# Patient Record
Sex: Female | Born: 1970 | Race: White | Hispanic: No | Marital: Married | Smoking: Never smoker
Health system: Southern US, Community
[De-identification: ages and names within clinical notes are randomized; demographics above are authoritative.]

## PROBLEM LIST (undated history)

## (undated) DIAGNOSIS — I10 Essential (primary) hypertension: Secondary | ICD-10-CM

## (undated) DIAGNOSIS — K219 Gastro-esophageal reflux disease without esophagitis: Secondary | ICD-10-CM

## (undated) DIAGNOSIS — F419 Anxiety disorder, unspecified: Secondary | ICD-10-CM

## (undated) DIAGNOSIS — N189 Chronic kidney disease, unspecified: Secondary | ICD-10-CM

## (undated) HISTORY — DX: Chronic kidney disease, unspecified: N18.9

## (undated) HISTORY — DX: Anxiety disorder, unspecified: F41.9

## (undated) HISTORY — DX: Essential (primary) hypertension: I10

## (undated) HISTORY — PX: TUBAL LIGATION: SHX77

## (undated) HISTORY — PX: CHOLECYSTECTOMY: SHX55

## (undated) HISTORY — DX: Gastro-esophageal reflux disease without esophagitis: K21.9

## (undated) HISTORY — PX: OTHER SURGICAL HISTORY: SHX169

---

## 2009-07-31 ENCOUNTER — Inpatient Hospital Stay (HOSPITAL_COMMUNITY): Admission: EM | Admit: 2009-07-31 | Discharge: 2009-08-04 | Payer: Self-pay | Admitting: Emergency Medicine

## 2009-07-31 ENCOUNTER — Ambulatory Visit: Payer: Self-pay | Admitting: Cardiology

## 2009-08-03 ENCOUNTER — Encounter (INDEPENDENT_AMBULATORY_CARE_PROVIDER_SITE_OTHER): Payer: Self-pay | Admitting: Internal Medicine

## 2011-02-11 LAB — CBC
HCT: 33.3 % — ABNORMAL LOW (ref 36.0–46.0)
HCT: 37 % (ref 36.0–46.0)
HCT: 38.8 % (ref 36.0–46.0)
Hemoglobin: 12.7 g/dL (ref 12.0–15.0)
Hemoglobin: 13.6 g/dL (ref 12.0–15.0)
MCHC: 34.5 g/dL (ref 30.0–36.0)
MCV: 92.2 fL (ref 78.0–100.0)
MCV: 92.8 fL (ref 78.0–100.0)
Platelets: 190 10*3/uL (ref 150–400)
Platelets: 207 10*3/uL (ref 150–400)
RDW: 12.9 % (ref 11.5–15.5)
RDW: 12.9 % (ref 11.5–15.5)
RDW: 13 % (ref 11.5–15.5)
WBC: 11.8 10*3/uL — ABNORMAL HIGH (ref 4.0–10.5)

## 2011-02-11 LAB — BASIC METABOLIC PANEL
BUN: 14 mg/dL (ref 6–23)
BUN: 18 mg/dL (ref 6–23)
CO2: 25 mEq/L (ref 19–32)
CO2: 27 mEq/L (ref 19–32)
Chloride: 107 mEq/L (ref 96–112)
Chloride: 108 mEq/L (ref 96–112)
Creatinine, Ser: 1.34 mg/dL — ABNORMAL HIGH (ref 0.4–1.2)
GFR calc Af Amer: 49 mL/min — ABNORMAL LOW (ref >60)
GFR calc Af Amer: 54 mL/min — ABNORMAL LOW (ref >60)
GFR calc non Af Amer: 41 mL/min — ABNORMAL LOW (ref >60)
Glucose, Bld: 108 mg/dL — ABNORMAL HIGH (ref 70–99)
Potassium: 3.8 mEq/L (ref 3.5–5.1)
Potassium: 3.8 mEq/L (ref 3.5–5.1)
Sodium: 138 mEq/L (ref 135–145)
Sodium: 140 mEq/L (ref 135–145)

## 2011-02-11 LAB — DIFFERENTIAL
Basophils Absolute: 0 10*3/uL (ref 0.0–0.1)
Basophils Absolute: 0 10*3/uL (ref 0.0–0.1)
Basophils Absolute: 0.1 10*3/uL (ref 0.0–0.1)
Basophils Relative: 0 % (ref 0–1)
Basophils Relative: 0 % (ref 0–1)
Eosinophils Absolute: 0.1 10*3/uL (ref 0.0–0.7)
Eosinophils Relative: 1 % (ref 0–5)
Eosinophils Relative: 1 % (ref 0–5)
Lymphocytes Relative: 20 % (ref 12–46)
Lymphocytes Relative: 21 % (ref 12–46)
Lymphs Abs: 2.5 10*3/uL (ref 0.7–4.0)
Monocytes Absolute: 0.2 10*3/uL (ref 0.1–1.0)
Monocytes Absolute: 0.3 10*3/uL (ref 0.1–1.0)
Neutro Abs: 9.2 10*3/uL — ABNORMAL HIGH (ref 1.7–7.7)
Neutrophils Relative %: 72 % (ref 43–77)
Neutrophils Relative %: 78 % — ABNORMAL HIGH (ref 43–77)

## 2011-02-11 LAB — URINALYSIS, ROUTINE W REFLEX MICROSCOPIC
Bilirubin Urine: NEGATIVE
Ketones, ur: NEGATIVE mg/dL
Nitrite: NEGATIVE
Protein, ur: NEGATIVE mg/dL
Urobilinogen, UA: 0.2 mg/dL (ref 0.0–1.0)
pH: 5.5 (ref 5.0–8.0)

## 2011-02-11 LAB — CARDIAC PANEL(CRET KIN+CKTOT+MB+TROPI)
CK, MB: 0.5 ng/mL (ref 0.3–4.0)
CK, MB: 1.4 ng/mL (ref 0.3–4.0)
Relative Index: INVALID (ref 0.0–2.5)
Relative Index: INVALID (ref 0.0–2.5)
Total CK: 26 U/L (ref 7–177)
Total CK: 33 U/L (ref 7–177)
Troponin I: 0.04 ng/mL (ref 0.00–0.06)

## 2011-02-11 LAB — RAPID URINE DRUG SCREEN, HOSP PERFORMED
Amphetamines: NOT DETECTED
Tetrahydrocannabinol: NOT DETECTED

## 2011-02-11 LAB — LIPID PANEL
Cholesterol: 137 mg/dL (ref 0–200)
Total CHOL/HDL Ratio: 2.7 RATIO
VLDL: 10 mg/dL (ref 0–40)

## 2011-02-11 LAB — URINE CULTURE
Colony Count: 10000
Special Requests: NEGATIVE

## 2011-02-11 LAB — COMPREHENSIVE METABOLIC PANEL
Albumin: 3.7 g/dL (ref 3.5–5.2)
Alkaline Phosphatase: 106 U/L (ref 39–117)
BUN: 12 mg/dL (ref 6–23)
Chloride: 106 mEq/L (ref 96–112)
Creatinine, Ser: 1.6 mg/dL — ABNORMAL HIGH (ref 0.4–1.2)
Glucose, Bld: 95 mg/dL (ref 70–99)
Potassium: 3.8 mEq/L (ref 3.5–5.1)
Total Bilirubin: 0.5 mg/dL (ref 0.3–1.2)

## 2011-02-11 LAB — HEMOGLOBIN A1C
Hgb A1c MFr Bld: 4.6 % (ref 4.6–6.1)
Mean Plasma Glucose: 85 mg/dL

## 2020-02-27 ENCOUNTER — Other Ambulatory Visit (HOSPITAL_COMMUNITY)
Admission: RE | Admit: 2020-02-27 | Discharge: 2020-02-27 | Disposition: A | Discharge status: Home / Self Care | Payer: Self-pay | Source: Ambulatory Visit | Attending: *Deleted | Admitting: *Deleted

## 2020-02-27 DIAGNOSIS — N183 Chronic kidney disease, stage 3 unspecified: Secondary | ICD-10-CM | POA: Insufficient documentation

## 2020-03-02 ENCOUNTER — Other Ambulatory Visit: Payer: Self-pay

## 2020-03-02 ENCOUNTER — Other Ambulatory Visit (HOSPITAL_COMMUNITY)
Admission: RE | Admit: 2020-03-02 | Discharge: 2020-03-02 | Disposition: A | Discharge status: Home / Self Care | Payer: Self-pay | Source: Ambulatory Visit | Attending: *Deleted | Admitting: *Deleted

## 2020-03-02 DIAGNOSIS — N183 Chronic kidney disease, stage 3 unspecified: Secondary | ICD-10-CM | POA: Insufficient documentation

## 2020-03-02 LAB — PROTEIN / CREATININE RATIO, URINE
Creatinine, Urine: 99.85 mg/dL
Total Protein, Urine: <6 mg/dL

## 2020-03-02 LAB — PROTEIN, URINE, 24 HOUR
Collection Interval-UPROT: 24 hours
Protein, Urine: <6 mg/dL
Urine Total Volume-UPROT: 1750 mL

## 2020-03-02 LAB — CREATININE, URINE, 24 HOUR
Collection Interval-UCRE24: 24 hours
Creatinine, 24H Ur: 1732 mg/d (ref 600–1800)
Creatinine, Urine: 98.99 mg/dL
Urine Total Volume-UCRE24: 1750 mL

## 2021-06-09 ENCOUNTER — Other Ambulatory Visit (HOSPITAL_COMMUNITY): Payer: Self-pay | Admitting: *Deleted

## 2021-06-09 DIAGNOSIS — Z1231 Encounter for screening mammogram for malignant neoplasm of breast: Secondary | ICD-10-CM

## 2021-06-28 ENCOUNTER — Ambulatory Visit (HOSPITAL_COMMUNITY)
Admission: RE | Admit: 2021-06-28 | Discharge: 2021-06-28 | Disposition: A | Discharge status: Home / Self Care | Payer: Self-pay | Source: Ambulatory Visit | Attending: *Deleted | Admitting: *Deleted

## 2021-06-28 ENCOUNTER — Other Ambulatory Visit: Payer: Self-pay

## 2021-06-28 ENCOUNTER — Encounter (HOSPITAL_COMMUNITY): Payer: Self-pay

## 2021-06-28 DIAGNOSIS — Z1231 Encounter for screening mammogram for malignant neoplasm of breast: Secondary | ICD-10-CM | POA: Insufficient documentation

## 2021-07-05 ENCOUNTER — Other Ambulatory Visit (HOSPITAL_COMMUNITY): Payer: Self-pay | Admitting: *Deleted

## 2021-07-05 DIAGNOSIS — R928 Other abnormal and inconclusive findings on diagnostic imaging of breast: Secondary | ICD-10-CM

## 2021-07-20 ENCOUNTER — Ambulatory Visit (HOSPITAL_COMMUNITY)
Admission: RE | Admit: 2021-07-20 | Discharge: 2021-07-20 | Disposition: A | Discharge status: Home / Self Care | Payer: Self-pay | Source: Ambulatory Visit | Attending: *Deleted | Admitting: *Deleted

## 2021-07-20 ENCOUNTER — Other Ambulatory Visit: Payer: Self-pay

## 2021-07-20 DIAGNOSIS — R928 Other abnormal and inconclusive findings on diagnostic imaging of breast: Secondary | ICD-10-CM

## 2021-07-21 ENCOUNTER — Telehealth: Payer: Self-pay

## 2021-07-21 NOTE — Telephone Encounter (Signed)
Telephoned patient at home number. Left a message with [facility] contact information. 

## 2021-07-22 NOTE — Telephone Encounter (Signed)
Telephoned patient, left a voice message with [facility] contact information.

## 2021-08-11 NOTE — Telephone Encounter (Signed)
Telephoned patient at home number. After interviewing patient, patient ineligible based on federal poverty guidelines.

## 2021-08-18 ENCOUNTER — Other Ambulatory Visit (HOSPITAL_COMMUNITY): Payer: Self-pay | Admitting: Obstetrics and Gynecology

## 2021-08-18 DIAGNOSIS — R928 Other abnormal and inconclusive findings on diagnostic imaging of breast: Secondary | ICD-10-CM

## 2021-08-27 ENCOUNTER — Encounter (HOSPITAL_COMMUNITY): Payer: Self-pay

## 2021-08-27 ENCOUNTER — Inpatient Hospital Stay (HOSPITAL_COMMUNITY): Payer: Self-pay | Attending: Obstetrics and Gynecology | Admitting: *Deleted

## 2021-08-27 ENCOUNTER — Other Ambulatory Visit: Payer: Self-pay

## 2021-08-27 ENCOUNTER — Other Ambulatory Visit (HOSPITAL_COMMUNITY): Payer: Self-pay | Admitting: *Deleted

## 2021-08-27 DIAGNOSIS — Z1211 Encounter for screening for malignant neoplasm of colon: Secondary | ICD-10-CM

## 2021-08-27 DIAGNOSIS — Z1239 Encounter for other screening for malignant neoplasm of breast: Secondary | ICD-10-CM

## 2021-08-27 NOTE — Progress Notes (Signed)
Ms. Sandy Rogers is a 50 y.o. female who presents to [facility] clinic today with no complaints. Patient referred to [facility] by the [facility] due to having a screening mammogram completed 06/28/2021 that additional imaging of bilateral breasts was recommended for follow-up. Bilateral diagnostic mammogram completed 07/20/2021 that bilateral breast biopsies recommended for follow-up.   Pap Smear: Pap smear not completed today. Last Pap smear was 710/09/2021 at [facility] clinic in [place] and was normal with negative HPV. Per patient has no history of an abnormal Pap smear. Last Pap smear result is available in Epic.   Physical exam: Breasts Breasts symmetrical. No skin abnormalities bilateral breasts. No nipple retraction bilateral breasts. No nipple discharge bilateral breasts. No lymphadenopathy. No lumps palpated bilateral breasts. No complaints of pain or tenderness on exam.     MS DIGITAL SCREENING TOMO BILATERAL  Result Date: 06/29/2021 CLINICAL DATA:  Screening. EXAM: DIGITAL SCREENING BILATERAL MAMMOGRAM WITH TOMOSYNTHESIS AND CAD TECHNIQUE: Bilateral screening digital craniocaudal and mediolateral oblique mammograms were obtained. Bilateral screening digital breast tomosynthesis was performed. The images were evaluated with computer-aided detection. COMPARISON:  None ACR Breast Density Category b: There are scattered areas of fibroglandular density. FINDINGS: In the right breast possible asymmetries warrant further evaluation. In the left breast possible asymmetries and a mass require further evaluation. IMPRESSION: Further evaluation is suggested for possible asymmetries in the right breast. Further evaluation is suggested for possible mass in the left breast. RECOMMENDATION: Diagnostic mammogram and possibly ultrasound of both breasts. (Code:[id number]) The patient will be contacted regarding the findings, and additional imaging will be scheduled. BI-RADS CATEGORY  0:  Incomplete. Need additional imaging evaluation and/or prior mammograms for comparison. Electronically Signed   By: Gerome Sam III M.D.   On: 06/29/2021 16:02   MS DIGITAL DIAG TOMO BILAT  Result Date: 07/20/2021 CLINICAL DATA:  Screening recall for right breast asymmetries and left breast masses. EXAM: DIGITAL DIAGNOSTIC BILATERAL MAMMOGRAM WITH TOMOSYNTHESIS AND CAD; ULTRASOUND LEFT BREAST LIMITED; ULTRASOUND RIGHT BREAST LIMITED TECHNIQUE: Bilateral digital diagnostic mammography and breast tomosynthesis was performed. The images were evaluated with computer-aided detection.; Targeted ultrasound examination of the left breast was performed.; Targeted ultrasound examination of the right breast was performed COMPARISON:  Screening mammogram dated 06/28/2021. ACR Breast Density Category b: There are scattered areas of fibroglandular density. FINDINGS: Additional tomograms were performed of the bilateral breasts. There are 2 oval masses in the upper-outer posterior left breast with the larger mass measuring 1.1 cm. Additional initially questioned mass in the upper outer anterior left breast is felt to resolve on the additional imaging with findings suggestive of an area of overlapping fibroglandular tissue. The initially questioned possible asymmetry seen in the far outer posterior right breast is less apparent and may be related to normal fibroglandular tissue. The initially questioned possible asymmetry in the inner right breast is also less apparent on the additional spot compression images. Targeted ultrasound of outer right breast was performed. There is an irregular mixed cystic and solid mass in the right breast at 9 o'clock 4 cm from nipple measuring 1 x 0.6 x 1.2 cm. This is likely incidental and probably does not correspond with the initially questioned asymmetry seen in the right breast at mammography. Targeted ultrasound of the entire inner right breast was performed. No suspicious masses or  abnormality seen, only few scattered areas of fibrocystic change and mildly dilated ducts identified which likely account for the initially questioned asymmetry seen in the slightly inner right breast at mammography.  Targeted ultrasound of the left breast was performed. There is an irregular hypoechoic mass in the left breast at 1 o'clock 12 cm from nipple measuring 0.9 x 0.7 x 0.7 cm. This corresponds well with the mass seen in the upper-outer left breast at mammography. An additional smaller mass demonstrating imaging features suggestive of a cluster of cysts at the 1 o'clock position 15 cm from nipple measures 0.3 x 0.4 x 0.3 cm. This corresponds with an additional small mass seen in the upper-outer left breast at mammography. No lymphadenopathy seen in the left axilla. IMPRESSION: 1. Indeterminate 1.2 cm mass in the right breast at the 9 o'clock position. 2. Indeterminate 0.9 cm mass in the left breast at the 1 o'clock position 12 cm from nipple. 3. Probably benign mass in the left breast at 1 o'clock 15 cm from nipple. RECOMMENDATION: 1. Recommend ultrasound-guided biopsy of the mass in the right breast at the 9 o'clock position. 2. Recommend ultrasound-guided biopsy of the mass in the left breast at 1 o'clock 12 cm from nipple. 3. Recommend short-term follow-up diagnostic mammography and ultrasound in 6 months for the mass in the left breast at 1 o'clock 15 cm from nipple. If above biopsies return as abnormal/malignant, then biopsy of this mass would be recommended rather than follow-up. I have discussed the findings and recommendations with the patient. If applicable, a reminder letter will be sent to the patient regarding the next appointment. BI-RADS CATEGORY  4: Suspicious. Electronically Signed   By: Edwin Cap M.D.   On: 07/20/2021 11:57   Pelvic/Bimanual Pap is not indicated today per BCCCP guidelines.   Smoking History: Patient has never smoked.   Patient Navigation: Patient education  provided. Access to services provided for patient through [facility]CP program.   Colorectal Cancer Screening: Per patient has never had colonoscopy completed. FIT Test given to patient to complete. No complaints today.    Breast and Cervical Cancer Risk Assessment: Patient has family history of her maternal grandmother having breast cancer. Patient has no known genetic mutations or history of radiation treatment to the chest before age 83. Patient does not have history of cervical dysplasia, immunocompromised, or DES exposure in-utero.  Risk Assessment     Risk Scores       08/27/2021   Last edited by: Priscille Heidelberg, RN   5-year risk: 0.7 %   Lifetime risk: 6.7 %           A: [facility]CP exam without pap smear No complaints.  P: Referred patient to [facility] for a bilateral breast biopsy per recommendation. Appointment scheduled Wednesday, September 01, 2021 at 0800.  Priscille Heidelberg, RN 08/27/2021 10:23 AM

## 2021-08-27 NOTE — Patient Instructions (Signed)
Explained breast self awareness with Martyn Malay. Patient did not need a Pap smear today due to last Pap smear and HPV typing was 08/17/2021. Let her know [facility] will cover Pap smears and HPV typing every 5 years unless has a history of abnormal Pap smears. Referred patient to [facility] for a bilateral breast biopsy per recommendation. Appointment scheduled Wednesday, September 01, 2021 at 0800. Patient aware of appointment and will be there. Martyn Malay verbalized understanding.  Winferd Wease, Kathaleen Maser, RN 10:23 AM

## 2021-09-01 ENCOUNTER — Ambulatory Visit (HOSPITAL_COMMUNITY)
Admission: RE | Admit: 2021-09-01 | Discharge: 2021-09-01 | Disposition: A | Discharge status: Home / Self Care | Payer: Self-pay | Source: Ambulatory Visit | Attending: Obstetrics and Gynecology | Admitting: Obstetrics and Gynecology

## 2021-09-01 ENCOUNTER — Encounter (HOSPITAL_COMMUNITY): Payer: Self-pay

## 2021-09-01 ENCOUNTER — Other Ambulatory Visit (HOSPITAL_COMMUNITY): Payer: Self-pay | Admitting: Obstetrics and Gynecology

## 2021-09-01 ENCOUNTER — Other Ambulatory Visit: Payer: Self-pay

## 2021-09-01 DIAGNOSIS — R928 Other abnormal and inconclusive findings on diagnostic imaging of breast: Secondary | ICD-10-CM

## 2021-09-01 MED ORDER — SODIUM BICARBONATE 4.2 % IV SOLN
INTRAVENOUS | Status: AC
  Administered 2021-09-01: 2.5 meq
  Filled 2021-09-01: qty 10

## 2021-09-01 MED ORDER — LIDOCAINE HCL (PF) 2 % IJ SOLN
INTRAMUSCULAR | Status: AC
  Administered 2021-09-01: 10 mL
  Filled 2021-09-01: qty 10

## 2021-09-01 MED ORDER — LIDOCAINE-EPINEPHRINE (PF) 1 %-1:200000 IJ SOLN
INTRAMUSCULAR | Status: AC
  Administered 2021-09-01: 10 mL
  Filled 2021-09-01: qty 30

## 2021-09-01 NOTE — Progress Notes (Signed)
PT tolerated bilateral breast biopsies well today with NAD noted. PT verbalized understanding of discharge instructions. PT ambulated back to the mammogram area this time and given ice packs. Specimens taken to lab for processing at this time by ultrasound tech.

## 2021-09-02 LAB — SURGICAL PATHOLOGY

## 2021-09-18 LAB — FECAL OCCULT BLOOD, IMMUNOCHEMICAL

## 2021-09-22 ENCOUNTER — Telehealth: Payer: Self-pay

## 2021-09-22 NOTE — Telephone Encounter (Signed)
Attempted to contact patient regarding FIT test. Left name and number for patient to call back.

## 2021-09-23 ENCOUNTER — Other Ambulatory Visit: Payer: Self-pay

## 2021-09-23 DIAGNOSIS — Z1211 Encounter for screening for malignant neoplasm of colon: Secondary | ICD-10-CM

## 2021-10-29 LAB — FECAL OCCULT BLOOD, IMMUNOCHEMICAL: Fecal Occult Bld: NEGATIVE

## 2023-03-01 IMAGING — MG MM BREAST LOCALIZATION CLIP
4 series · 4 of 12 positions shown · non-contrast
Comparison: Previous exam(s).

CLINICAL DATA: Status post ultrasound-guided core needle biopsy of
the recently demonstrated 1.2 cm mass in the 9 o'clock position of
the right breast and 0.9 cm mass in the 1 o'clock position of the
left breast.

EXAM:
3D DIAGNOSTIC BILATERAL MAMMOGRAM POST ULTRASOUND BIOPSY X 2

[R CC synth-2D]
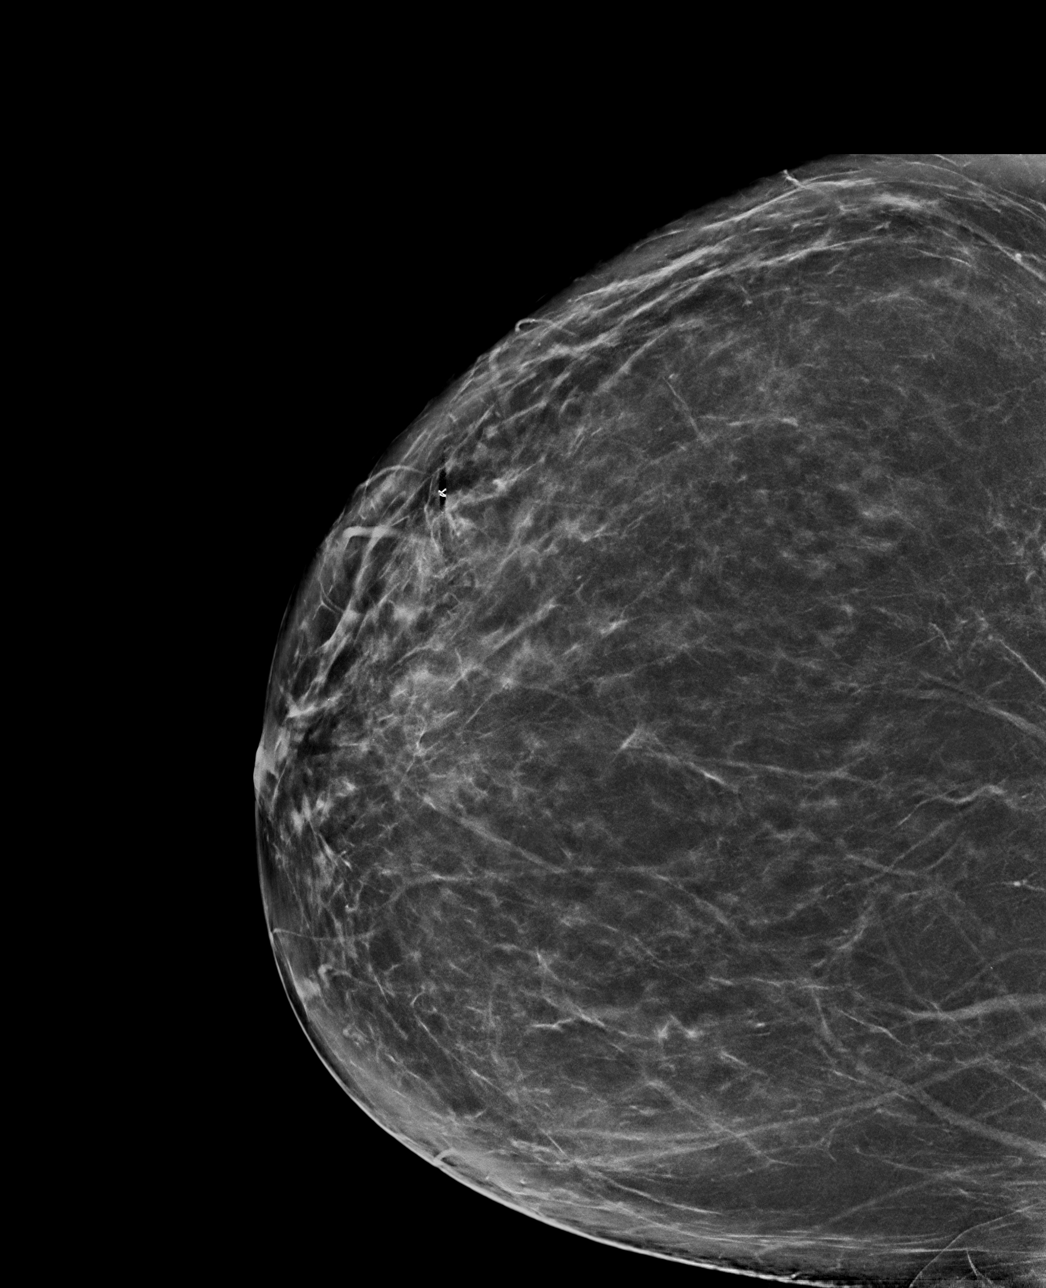

[R ML synth-2D]
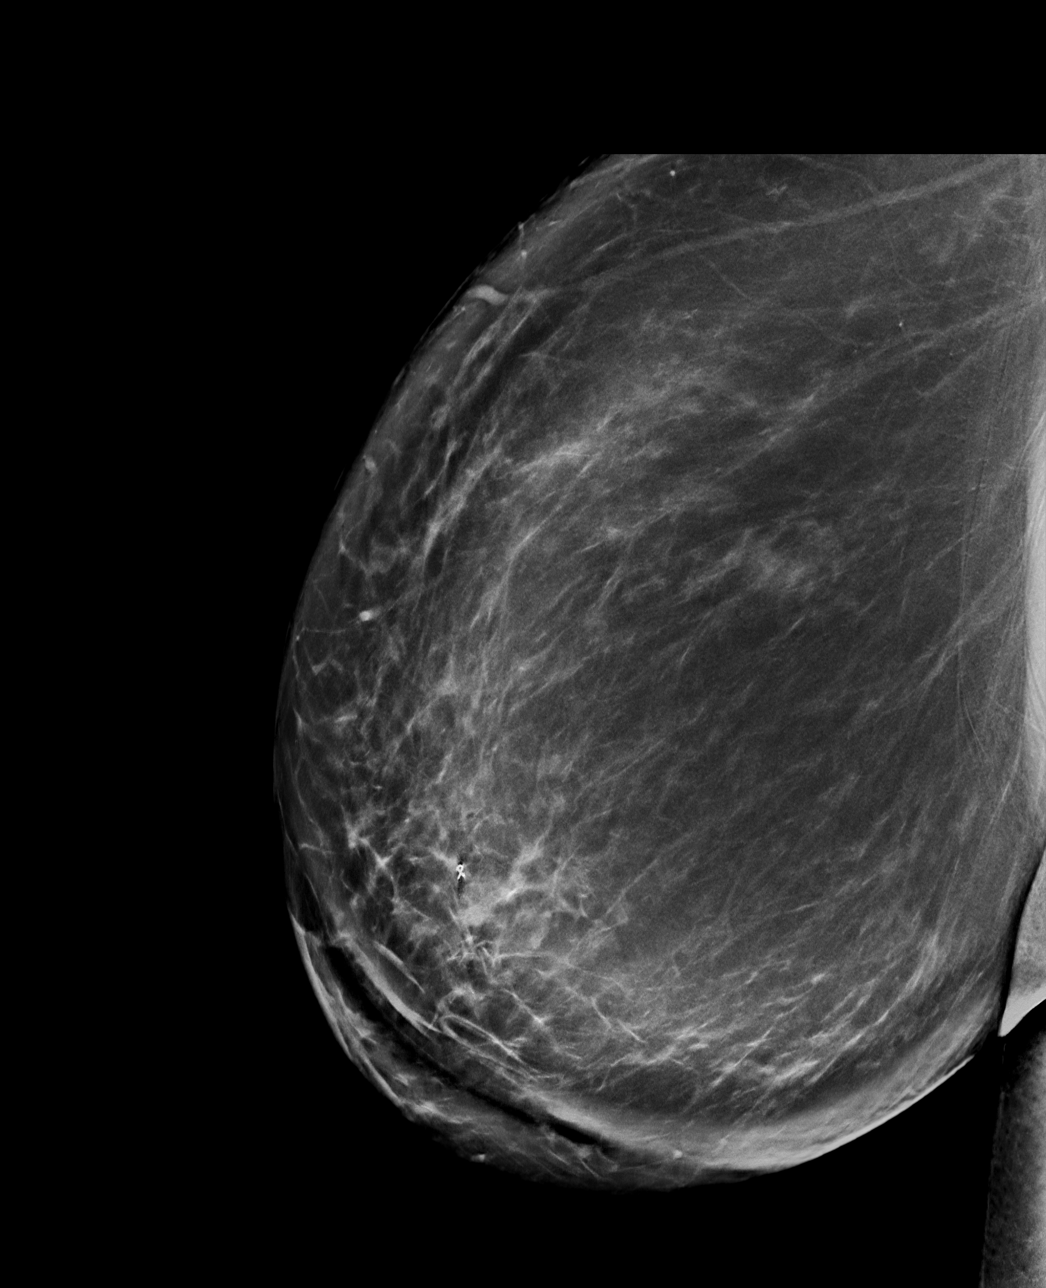

[R CC tomo · tomo slice 48/95.0]
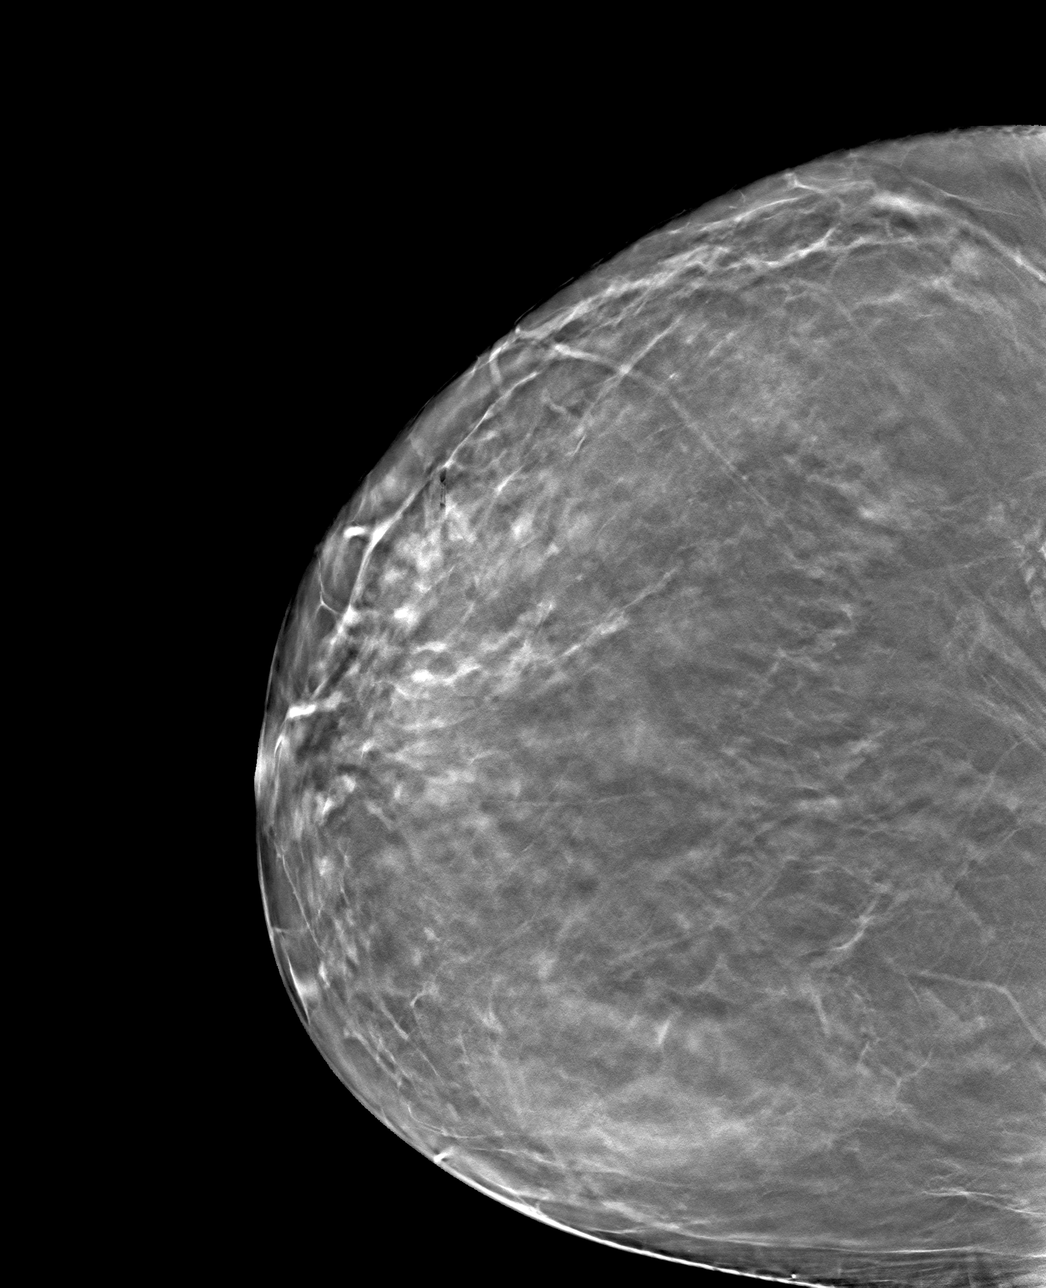

[R ML tomo · tomo slice 56/111.0]
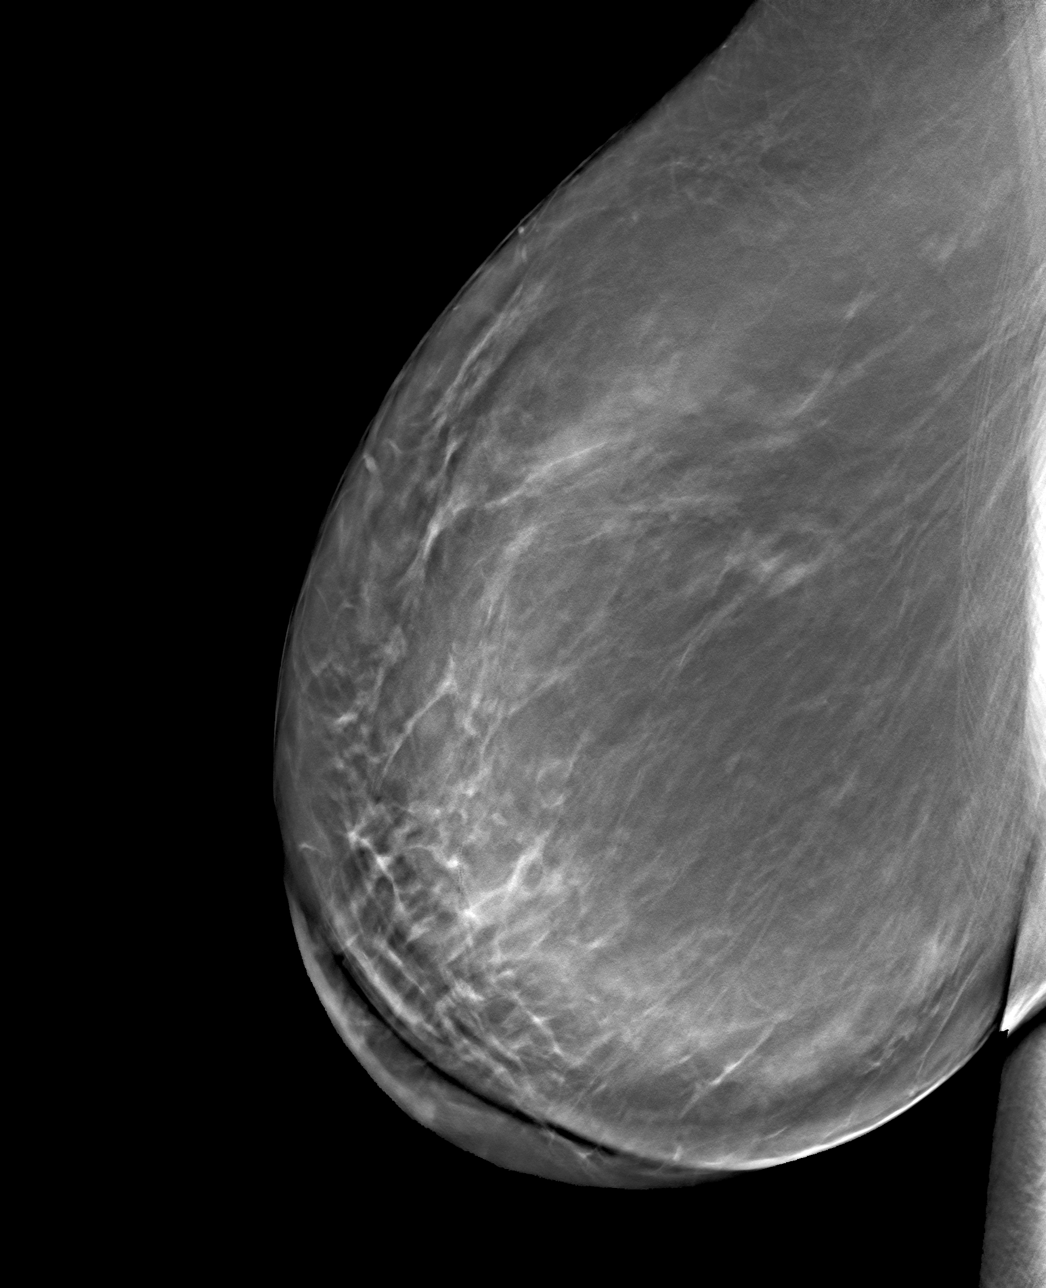

[4 of 12 positions shown; findings below may reference images not displayed]

FINDINGS: 3D Mammographic images were obtained following ultrasound guided
biopsy of a 1.2 cm mass in the 9 o'clock position of the right
breast and 0.9 cm mass in the 1 o'clock position of the left breast.
The biopsy marking clips are in expected positions at the sites of
biopsy. The clip on the right does not correspond to either recently
demonstrated mammographic asymmetry felt to probably represent
normal fibroglandular tissue with no ultrasound correlates. The clip
on the left is within the biopsied larger mass seen on the
mammograms. This is in the 2:30 o'clock position of the breast.
IMPRESSION: 1. Appropriate positioning of the ribbon shaped biopsy marking clip
at the site of biopsy in the 9 o'clock position of the right breast.
This does not correspond to either of the recently suspected right
breast asymmetries.
2. Appropriate positioning of the ribbon shaped biopsy marker clip
at the site of biopsy in the 2:30 o'clock position of the left
breast, marked 1 o'clock on the previous ultrasound images.

Final Assessment: Post Procedure Mammograms for Marker Placement

## 2023-03-01 IMAGING — MG MM BREAST LOCALIZATION CLIP
4 series · 4 of 12 positions shown · non-contrast
Comparison: Previous exam(s).

CLINICAL DATA: Status post ultrasound-guided core needle biopsy of
the recently demonstrated 1.2 cm mass in the 9 o'clock position of
the right breast and 0.9 cm mass in the 1 o'clock position of the
left breast.

EXAM:
3D DIAGNOSTIC BILATERAL MAMMOGRAM POST ULTRASOUND BIOPSY X 2

[L ML synth-2D]
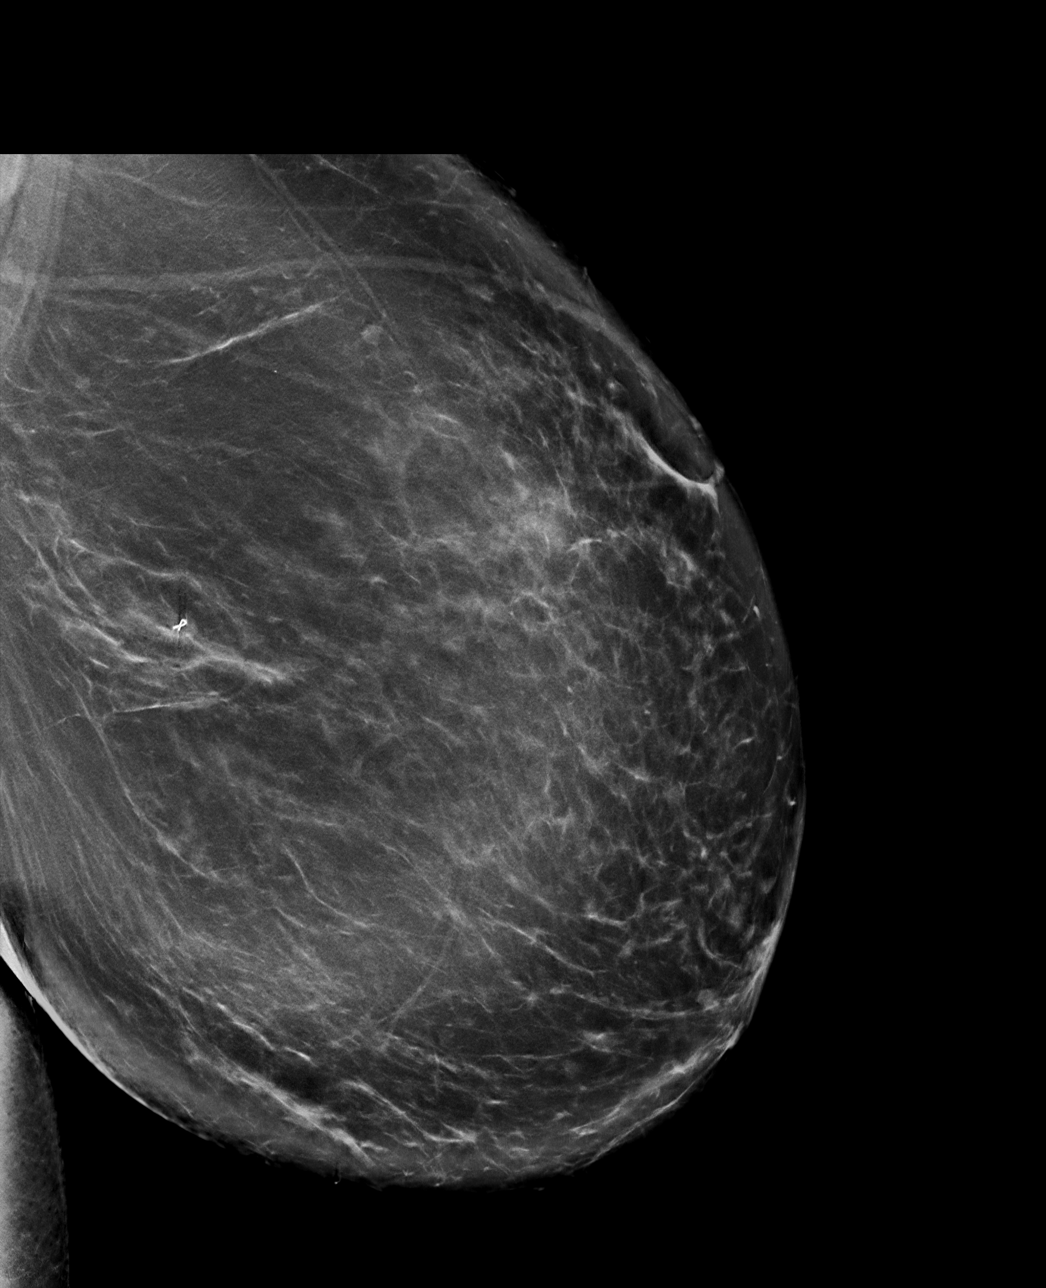

[L CC synth-2D]
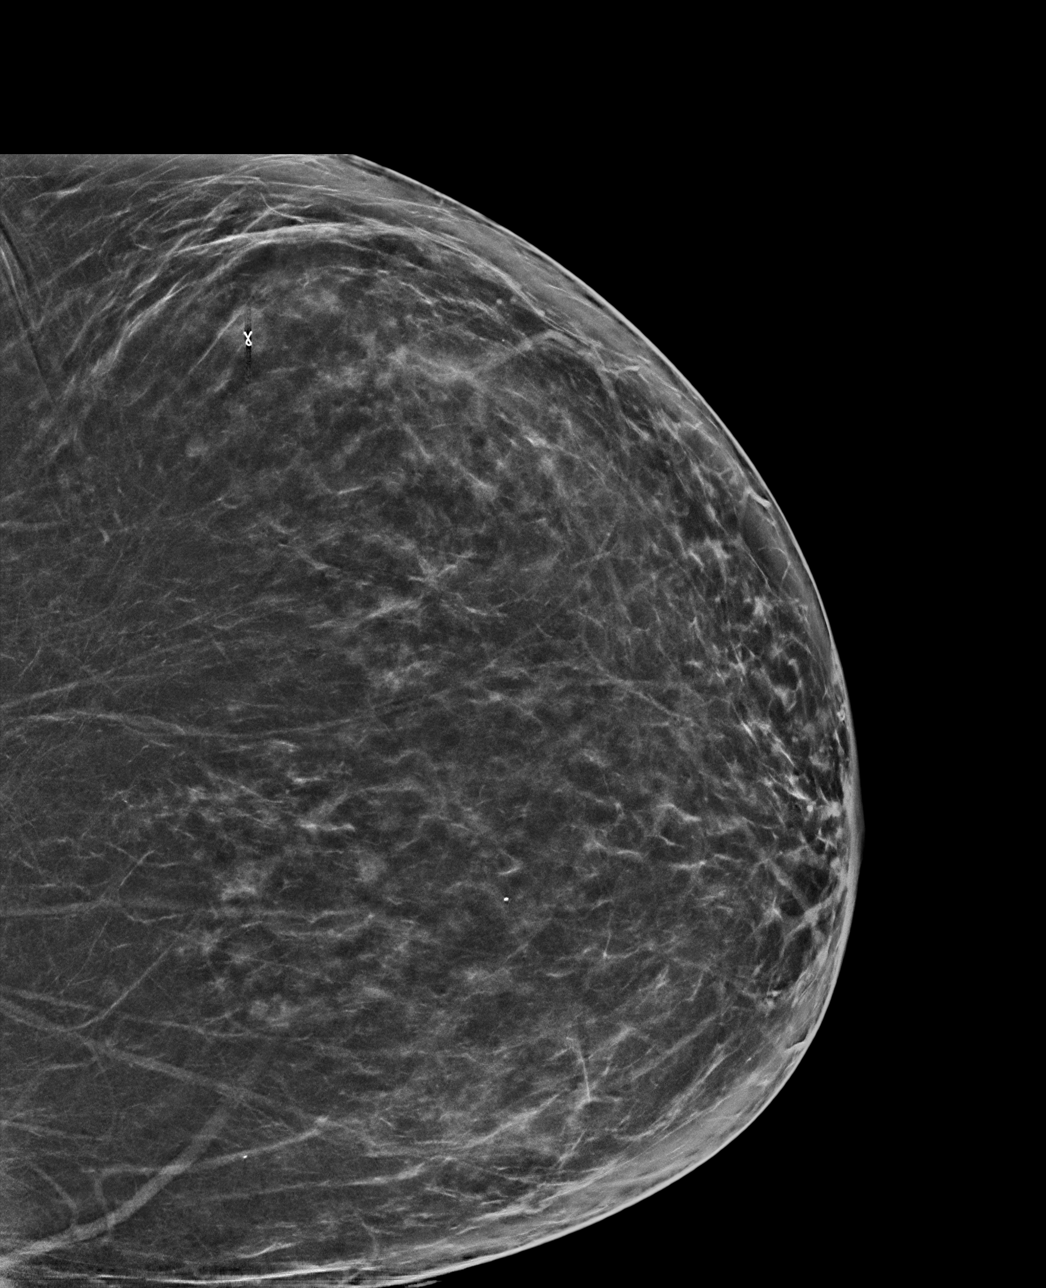

[L ML tomo · tomo slice 56/111.0]
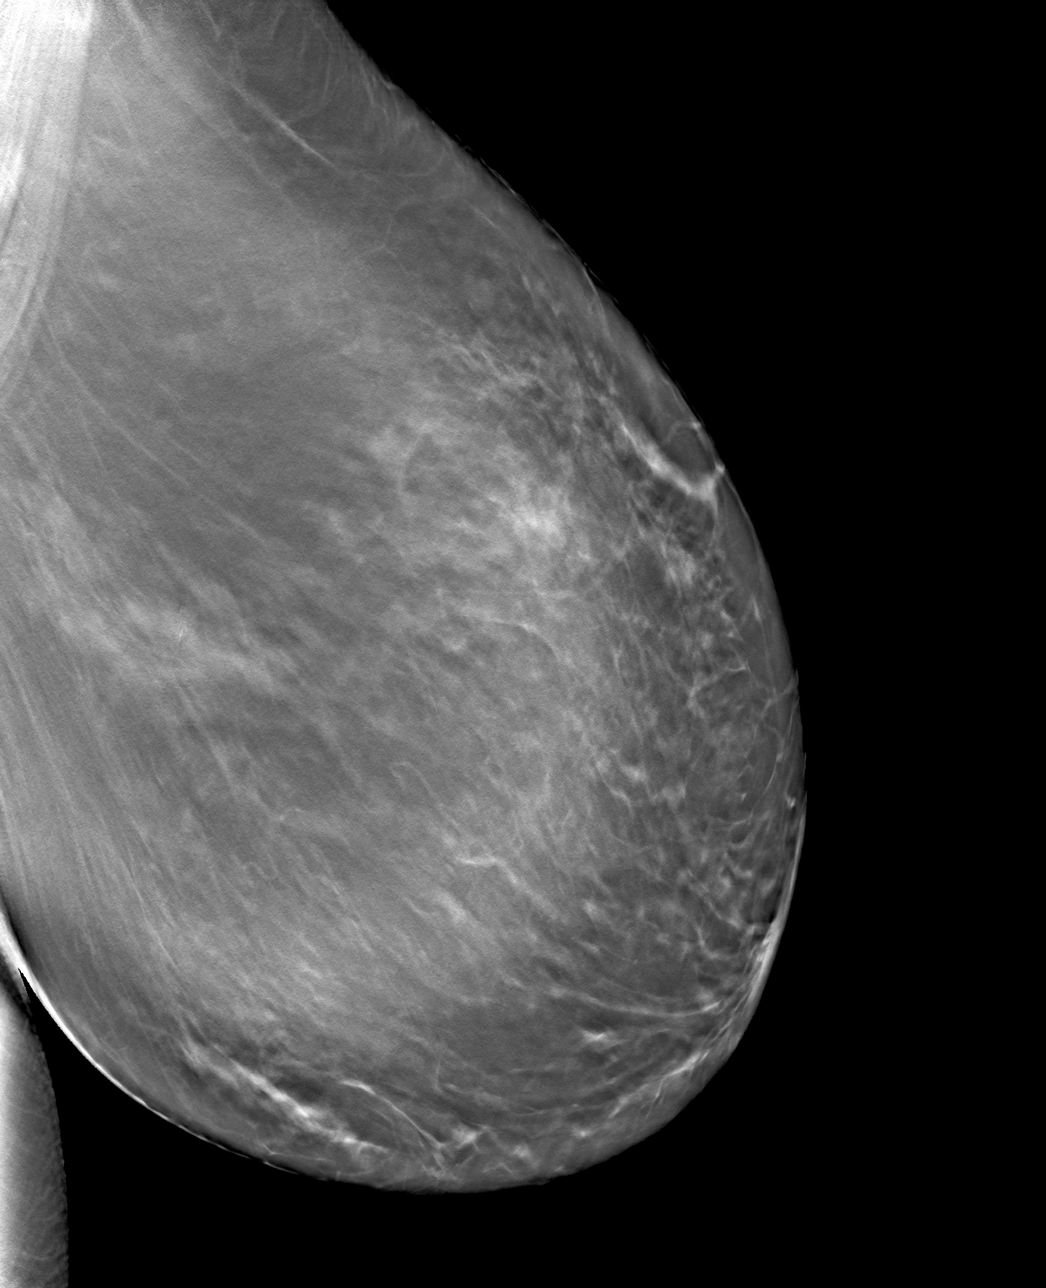

[L CC tomo · tomo slice 45/90.0]
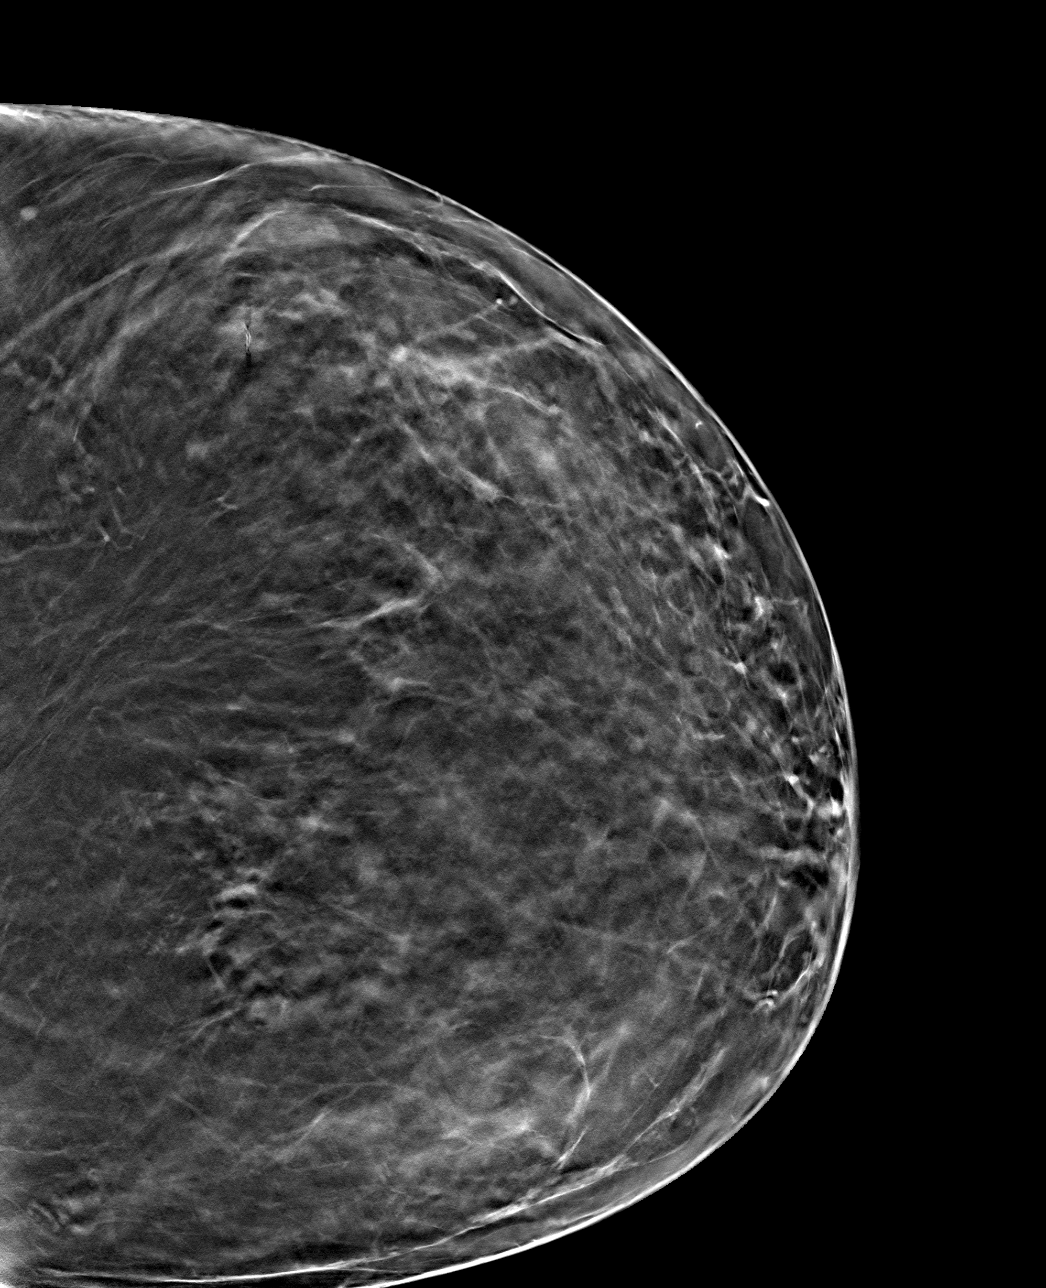

[4 of 12 positions shown; findings below may reference images not displayed]

FINDINGS: 3D Mammographic images were obtained following ultrasound guided
biopsy of a 1.2 cm mass in the 9 o'clock position of the right
breast and 0.9 cm mass in the 1 o'clock position of the left breast.
The biopsy marking clips are in expected positions at the sites of
biopsy. The clip on the right does not correspond to either recently
demonstrated mammographic asymmetry felt to probably represent
normal fibroglandular tissue with no ultrasound correlates. The clip
on the left is within the biopsied larger mass seen on the
mammograms. This is in the 2:30 o'clock position of the breast.
IMPRESSION: 1. Appropriate positioning of the ribbon shaped biopsy marking clip
at the site of biopsy in the 9 o'clock position of the right breast.
This does not correspond to either of the recently suspected right
breast asymmetries.
2. Appropriate positioning of the ribbon shaped biopsy marker clip
at the site of biopsy in the 2:30 o'clock position of the left
breast, marked 1 o'clock on the previous ultrasound images.

Final Assessment: Post Procedure Mammograms for Marker Placement

## 2023-04-14 ENCOUNTER — Telehealth: Payer: Self-pay

## 2023-04-14 NOTE — Telephone Encounter (Signed)
Attempted to follow up [facility] client no answer, unable to leave a message. Client PCP [facility] and next appointment is scheduled for 06/21/23 Welcome letter was sent on 04/05/23 this call was an attempt to reach for follow up.   Francee Nodal RN  [employer]

## 2023-05-25 ENCOUNTER — Telehealth: Payer: Self-pay

## 2023-05-25 NOTE — Telephone Encounter (Signed)
Attempted call to dual enrolled [facility] client from [facility] for follow up and wellness check in. No answer and unable to leave a message, stating client number unavailable.    Francee Nodal RN [employer]

## 2024-05-28 ENCOUNTER — Telehealth: Payer: Self-pay

## 2024-05-28 NOTE — Telephone Encounter (Signed)
 Attempted follow up call to [facility] client no answer left message. [facility] expired since 03/20/24 Next appt is scheduled for 07/19/24 Last seen 04/18/24  Avelina JONELLE Skeen RN [facility]
